# Patient Record
Sex: Male | Born: 1975 | Race: White | Hispanic: No | Marital: Married | Smoking: Never smoker
Health system: Southern US, Community
[De-identification: ages and names within clinical notes are randomized; demographics above are authoritative.]

## PROBLEM LIST (undated history)

## (undated) DIAGNOSIS — C801 Malignant (primary) neoplasm, unspecified: Secondary | ICD-10-CM

## (undated) DIAGNOSIS — S3992XA Unspecified injury of lower back, initial encounter: Secondary | ICD-10-CM

## (undated) DIAGNOSIS — R251 Tremor, unspecified: Secondary | ICD-10-CM

## (undated) HISTORY — PX: INCISION AND DRAINAGE ABSCESS: SHX5864

## (undated) HISTORY — DX: Unspecified injury of lower back, initial encounter: S39.92XA

## (undated) HISTORY — PX: WISDOM TOOTH EXTRACTION: SHX21

## (undated) HISTORY — DX: Tremor, unspecified: R25.1

---

## 2009-10-20 ENCOUNTER — Emergency Department: Payer: Self-pay | Admitting: Unknown Physician Specialty

## 2012-08-13 ENCOUNTER — Ambulatory Visit (INDEPENDENT_AMBULATORY_CARE_PROVIDER_SITE_OTHER): Payer: BC Managed Care – PPO | Admitting: Internal Medicine

## 2012-08-27 ENCOUNTER — Ambulatory Visit (INDEPENDENT_AMBULATORY_CARE_PROVIDER_SITE_OTHER): Payer: BC Managed Care – PPO | Admitting: Internal Medicine

## 2012-08-27 ENCOUNTER — Encounter: Payer: Self-pay | Admitting: Internal Medicine

## 2012-08-27 VITALS — BP 110/62 | HR 62 | Temp 98.8°F | Ht 68.75 in | Wt 212.0 lb

## 2012-08-27 DIAGNOSIS — G252 Other specified forms of tremor: Secondary | ICD-10-CM

## 2012-08-27 DIAGNOSIS — G250 Essential tremor: Secondary | ICD-10-CM

## 2012-08-27 DIAGNOSIS — R209 Unspecified disturbances of skin sensation: Secondary | ICD-10-CM

## 2012-08-27 DIAGNOSIS — Z0000 Encounter for general adult medical examination without abnormal findings: Secondary | ICD-10-CM

## 2012-08-27 DIAGNOSIS — R200 Anesthesia of skin: Secondary | ICD-10-CM | POA: Insufficient documentation

## 2012-08-27 LAB — CBC WITH DIFFERENTIAL/PLATELET
Basophils Absolute: 0 10*3/uL (ref 0.0–0.1)
Basophils Relative: 1 % (ref 0–1)
Eosinophils Absolute: 0.2 10*3/uL (ref 0.0–0.7)
Eosinophils Relative: 3 % (ref 0–5)
HCT: 42.2 % (ref 39.0–52.0)
Hemoglobin: 14.6 g/dL (ref 13.0–17.0)
MCH: 30.9 pg (ref 26.0–34.0)
MCHC: 34.6 g/dL (ref 30.0–36.0)
MCV: 89.4 fL (ref 78.0–100.0)
Monocytes Absolute: 0.3 10*3/uL (ref 0.1–1.0)
Monocytes Relative: 5 % (ref 3–12)
RDW: 13.3 % (ref 11.5–15.5)

## 2012-08-27 MED ORDER — ATENOLOL 25 MG PO TABS: 25.0000 mg | ORAL_TABLET | Freq: Every day | ORAL | Status: AC

## 2012-08-27 NOTE — Progress Notes (Signed)
Subjective:    Patient ID: James Tyler, male    DOB: 12-21-1975, 37 y.o.   MRN: [id number]  HPI 37 year old male with history of bilateral hand tremor presents to establish care. He reports that he has had a tremor in both of his hands for years. He had an episode in the past of worsening symptoms of tremor, precipitated by increased stress. He was briefly put on medication which he thinks may have been a beta blocker. He had some improvement with this but then subsequently discontinued the medication. He has not been on medication for years. Over the last several months, he has had some increased stress at work and has noted worsening symptoms of tremor. Shaking in his hands seems most prominent when using both hands at the same time. He notices symptoms particularly when holding drinks. Symptoms are exacerbated by stress and improved with use of alcohol. He denies any weakness or numbness in his hands.   He notes this has been a very stressful time at work. There was some concern that he may lose his job. He is also starting grad school. He is engaged to be married and living with his girlfriend.  He also notes a several year history of numbness across his right anterior thigh. This has never been evaluated. He thinks the numbness may be secondary to traumatic injury to his back which occurred playing football several years ago. He denies any weakness in his leg. He has never had other focal neurologic symptoms such as weakness, other areas of numbness, loss of bowel or bladder function, visual changes.  Outpatient Encounter Prescriptions as of 08/27/2012  Medication Sig Dispense Refill  . atenolol (TENORMIN) 25 MG tablet Take 1 tablet (25 mg total) by mouth daily.  90 tablet  3   No facility-administered encounter medications on file as of 08/27/2012.   BP 110/62  Pulse 62  Temp(Src) 98.8 F (37.1 C) (Oral)  Ht 5' 8.75" (1.746 m)  Wt 212 lb (96.163 kg)  BMI 31.54 kg/m2  SpO2 98%  Review  of Systems  Constitutional: Negative for fever, chills, activity change, appetite change, fatigue and unexpected weight change.  Eyes: Negative for visual disturbance.  Respiratory: Negative for cough and shortness of breath.   Cardiovascular: Negative for chest pain, palpitations and leg swelling.  Gastrointestinal: Negative for abdominal pain and abdominal distention.  Genitourinary: Negative for dysuria, urgency and difficulty urinating.  Musculoskeletal: Negative for arthralgias and gait problem.  Skin: Negative for color change and rash.  Neurological: Positive for tremors and numbness. Negative for dizziness, seizures, syncope, facial asymmetry, speech difficulty, weakness, light-headedness and headaches.  Hematological: Negative for adenopathy.  Psychiatric/Behavioral: Negative for sleep disturbance and dysphoric mood. The patient is not nervous/anxious.        Objective:   Physical Exam  Constitutional: He is oriented to person, place, and time. He appears well-developed and well-nourished. No distress.  HENT:  Head: Normocephalic and atraumatic.  Right Ear: External ear normal.  Left Ear: External ear normal.  Nose: Nose normal.  Mouth/Throat: Oropharynx is clear and moist. No oropharyngeal exudate.  Eyes: Conjunctivae and EOM are normal. Pupils are equal, round, and reactive to light. Right eye exhibits no discharge. Left eye exhibits no discharge. No scleral icterus.  Neck: Normal range of motion. Neck supple. No tracheal deviation present. No thyromegaly present.  Cardiovascular: Normal rate, regular rhythm and normal heart sounds.  Exam reveals no gallop and no friction rub.   No murmur heard. Pulmonary/Chest: Effort normal  and breath sounds normal. No respiratory distress. He has no wheezes. He has no rales. He exhibits no tenderness.  Musculoskeletal: Normal range of motion. He exhibits no edema.  Lymphadenopathy:    He has no cervical adenopathy.  Neurological: He is  alert and oriented to person, place, and time. He has normal strength. He displays tremor (resting tremor bilateral hands). He displays no atrophy. No cranial nerve deficit or sensory deficit. He exhibits normal muscle tone. Coordination and gait normal.  Skin: Skin is warm and dry. No rash noted. He is not diaphoretic. No erythema. No pallor.  Psychiatric: He has a normal mood and affect. His behavior is normal. Judgment and thought content normal.          Assessment & Plan:

## 2012-08-27 NOTE — Assessment & Plan Note (Signed)
Bilateral hand tremor over several years, worsened with increased stress. Consistent with benign essential tremor. Will restart betablocker, Atenolol, given recent worsening of symptoms. Will set up neurology evaluation to see if any additional testing might be helpful, particularly given focal numbness in right thigh.

## 2012-08-27 NOTE — Assessment & Plan Note (Signed)
Localized area of numbness right anterior thigh. Question if this may be related to previous h/o injury to the lumbar spine. Exam normal today and sensation intact to light touch, monofilament. Will check TSH, B12 with labs. Will set up neurology evaluation. Question if EMG testing might be helpful.

## 2012-08-28 LAB — COMPREHENSIVE METABOLIC PANEL
ALT: 27 U/L (ref 0–53)
AST: 19 U/L (ref 0–37)
Alkaline Phosphatase: 55 U/L (ref 39–117)
Calcium: 10.2 mg/dL (ref 8.4–10.5)
Chloride: 104 mEq/L (ref 96–112)
Creat: 1.14 mg/dL (ref 0.50–1.35)
Total Bilirubin: 0.7 mg/dL (ref 0.3–1.2)

## 2012-08-28 LAB — LIPID PANEL
LDL Cholesterol: 127 mg/dL — ABNORMAL HIGH (ref 0–99)
Total CHOL/HDL Ratio: 3.6 Ratio
VLDL: 24 mg/dL (ref 0–40)

## 2012-08-30 ENCOUNTER — Encounter: Payer: Self-pay | Admitting: *Deleted

## 2012-09-21 ENCOUNTER — Ambulatory Visit (INDEPENDENT_AMBULATORY_CARE_PROVIDER_SITE_OTHER): Payer: BC Managed Care – PPO | Admitting: Neurology

## 2012-09-21 ENCOUNTER — Encounter: Payer: Self-pay | Admitting: Neurology

## 2012-09-21 VITALS — BP 104/62 | HR 52 | Temp 98.3°F | Resp 16 | Wt 214.0 lb

## 2012-09-21 DIAGNOSIS — G5710 Meralgia paresthetica, unspecified lower limb: Secondary | ICD-10-CM

## 2012-09-21 DIAGNOSIS — G250 Essential tremor: Secondary | ICD-10-CM

## 2012-09-21 DIAGNOSIS — G252 Other specified forms of tremor: Secondary | ICD-10-CM

## 2012-09-21 DIAGNOSIS — G5711 Meralgia paresthetica, right lower limb: Secondary | ICD-10-CM | POA: Insufficient documentation

## 2012-09-21 NOTE — Patient Instructions (Addendum)
1.  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  Let us know if you need [place] in the future.  Good luck in grad school!

## 2012-09-21 NOTE — Progress Notes (Signed)
Subjective:   James Tyler was seen in consultation in the movement disorder clinic at the request of Wynona Dove, MD. The evaluation is for tremor.  The patient is a 37 y.o. right handed male with a history of tremor.  He reports tremor for as long as he can remember.  He remembers tremor being noticed even into 5th grade.  It does not affect what he does, but does increase with stress.  Both hands are the safe.  There is no family hx of tremor.    Affected by caffeine:  no Affected by alcohol:  yes Affected by stress:  yes (and under increase amt of stress at work as job is being audited) Affected by fatigue:  no Spills soup if on spoon:  no Spills glass of liquid if full:  no Affects ADL's (tying shoes, brushing teeth, etc):  no  Current/Previously tried tremor medications: atenolol started 08/27/12 (noted that it is helpful and maybe little drowsy but nothing significant)  Current medications that may exacerbate tremor:  n/a  He also c/o R anterior thigh paresthesias.   He states that the numbness started about 4 years ago.  There is no weakness.  He assumes it is a football injury, even though he has not played since high school.  He does state that he is more out of shape and has gained weight over the years.  If he scratches the area, he notes that it does not feel the same as the other side.  He has no back pain.  Outside reports reviewed: historical medical records.  No Known Allergies  Current Outpatient Prescriptions on File Prior to Visit  Medication Sig Dispense Refill  . atenolol (TENORMIN) 25 MG tablet Take 1 tablet (25 mg total) by mouth daily.  90 tablet  3   No current facility-administered medications on file prior to visit.    Past Medical History  Diagnosis Date  . Tremors of nervous system     hand tremor  . Back injury 1997, 2000    Football    Past Surgical History  Procedure Laterality Date  . Wisdom tooth extraction    . Incision and  drainage abscess      History   Social History  . Marital Status: Single    Spouse Name: N/A    Number of Children: N/A  . Years of Education: N/A   Occupational History  . Not on file.   Social History Main Topics  . Smoking status: Never Smoker   . Smokeless tobacco: Never Used  . Alcohol Use: Yes     Comment: social  . Drug Use: No  . Sexually Active: Not on file   Other Topics Concern  . Not on file   Social History Narrative   Lives in [place] with fiance. Dog and 3 cats.      Work - Interior and spatial designer, Engineer, agricultural      Diet - regular      Exercise - runs occasionally    Family Status  Relation Status Death Age  . Mother Alive     healthy  . Father Alive     healthy    Review of Systems A complete 10 system ROS was obtained and was negative apart from what is mentioned.   Objective:   VITALS:   Filed Vitals:   09/21/12 1245  BP: 104/62  Pulse: 52  Temp: 98.3 F (36.8 C)  Resp: 16  Weight: 214 lb (97.07 kg)  Gen:  Appears stated age and in NAD. HEENT:  Normocephalic, atraumatic. The mucous membranes are moist. The superficial temporal arteries are without ropiness or tenderness. Cardiovascular: Regular rate and rhythm. Lungs: Clear to auscultation bilaterally. Neck: There are no carotid bruits noted bilaterally.  NEUROLOGICAL:  Orientation:  The patient is alert and oriented x 3.  Recent and remote memory are intact.  Attention span and concentration are normal.  Able to name objects and repeat without trouble.  Fund of knowledge is appropriate Cranial nerves: There is good facial symmetry. The pupils are equal round and reactive to light bilaterally. Fundoscopic exam reveals clear disc margins bilaterally. Extraocular muscles are intact and visual fields are full to confrontational testing. Speech is fluent and clear. Soft palate rises symmetrically and there is no tongue deviation. Hearing is intact to conversational tone. Tone:  Tone is good throughout. Sensation: Sensation is intact to light touch and pinprick throughout (facial, trunk, extremities). There is decreased pin over the R anterior and lat thigh c/t the L.  Vibration is intact at the bilateral big toe. There is no extinction with double simultaneous stimulation. There is no sensory dermatomal level identified. Coordination:  The patient has no dysdiadichokinesia or dysmetria. Motor: Strength is 5/5 in the bilateral upper and lower extremities.  Shoulder shrug is equal bilaterally.  There is no pronator drift.  There are no fasciculations noted. DTR's: Deep tendon reflexes are 2/4 at the bilateral biceps, triceps, brachioradialis, patella and achilles.  Plantar responses are downgoing bilaterally. Gait and Station: The patient is able to ambulate without difficulty. The patient is able to heel toe walk without any difficulty. The patient is able to ambulate in a tandem fashion. The patient is able to stand in the Romberg position.   MOVEMENT EXAM: Tremor:  There is minimal tremor in the RUE, noted most significantly with posture.  None is seen in the L.  The patient is able to draw Archimedes spirals without significant difficulty.  There is no tremor at rest.      LABS:  Lab Results  Component Value Date   WBC 6.5 08/27/2012   HGB 14.6 08/27/2012   HCT 42.2 08/27/2012   MCV 89.4 08/27/2012   PLT 261 08/27/2012   Lab Results  Component Value Date   TSH 1.491 08/27/2012     Chemistry      Component Value Date/Time   NA 140 08/27/2012 1514   K 4.5 08/27/2012 1514   CL 104 08/27/2012 1514   CO2 28 08/27/2012 1514   BUN 10 08/27/2012 1514   CREATININE 1.14 08/27/2012 1514      Component Value Date/Time   CALCIUM 10.2 08/27/2012 1514   ALKPHOS 55 08/27/2012 1514   AST 19 08/27/2012 1514   ALT 27 08/27/2012 1514   BILITOT 0.7 08/27/2012 1514          Assessment/Plan:   1.  Tremor.  -This could be benign essential tremor but it could also be an enhanced  physiologic tremor.  Either way, the tremor is very minimal currently and he is happy with the atenolol.  I would not change that. 2.  right lateral thigh paresthesias.  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  He and I talked about the pathophysiology of this.  It really has not been particularly bothersome.  He does relate that he has gained weight, which could be the etiology.  Nonetheless, I do not think that he needs any treatment.  He has  no pain associated with it, including back pain. 3.  He will followup with me on an as-needed basis.

## 2012-10-18 ENCOUNTER — Telehealth: Payer: Self-pay | Admitting: *Deleted

## 2012-10-18 NOTE — Telephone Encounter (Signed)
Patient left message because he received a bill for his last visit, would like someone to explain why it is showing a copayment? He did make his copayment when he was here.

## 2012-10-20 NOTE — Telephone Encounter (Signed)
I asked patient to return my call I am going to give him the billing number which is [phone] which I left on his voice mail. I am not sure why it is showing a co-pay.

## 2013-02-18 ENCOUNTER — Ambulatory Visit: Payer: BC Managed Care – PPO | Admitting: Internal Medicine

## 2019-03-21 ENCOUNTER — Other Ambulatory Visit: Payer: Self-pay

## 2019-03-25 ENCOUNTER — Ambulatory Visit: Payer: BC Managed Care – PPO | Attending: Internal Medicine

## 2019-03-25 DIAGNOSIS — Z20822 Contact with and (suspected) exposure to covid-19: Secondary | ICD-10-CM

## 2019-03-26 LAB — NOVEL CORONAVIRUS, NAA: SARS-CoV-2, NAA: NOT DETECTED

## 2019-05-07 ENCOUNTER — Emergency Department
Admission: EM | Admit: 2019-05-07 | Discharge: 2019-05-07 | Disposition: A | Discharge status: Home / Self Care | Payer: BC Managed Care – PPO | Attending: Student in an Organized Health Care Education/Training Program | Admitting: Student in an Organized Health Care Education/Training Program

## 2019-05-07 ENCOUNTER — Emergency Department: Payer: BC Managed Care – PPO

## 2019-05-07 ENCOUNTER — Other Ambulatory Visit: Payer: Self-pay

## 2019-05-07 DIAGNOSIS — Z79899 Other long term (current) drug therapy: Secondary | ICD-10-CM | POA: Insufficient documentation

## 2019-05-07 DIAGNOSIS — R079 Chest pain, unspecified: Secondary | ICD-10-CM | POA: Diagnosis not present

## 2019-05-07 DIAGNOSIS — C6290 Malignant neoplasm of unspecified testis, unspecified whether descended or undescended: Secondary | ICD-10-CM | POA: Insufficient documentation

## 2019-05-07 DIAGNOSIS — R55 Syncope and collapse: Secondary | ICD-10-CM

## 2019-05-07 DIAGNOSIS — R42 Dizziness and giddiness: Secondary | ICD-10-CM | POA: Insufficient documentation

## 2019-05-07 HISTORY — DX: Malignant (primary) neoplasm, unspecified: C80.1

## 2019-05-07 LAB — CBC
HCT: 39.7 % (ref 39.0–52.0)
Hemoglobin: 13.1 g/dL (ref 13.0–17.0)
MCH: 31.5 pg (ref 26.0–34.0)
MCHC: 33 g/dL (ref 30.0–36.0)
MCV: 95.4 fL (ref 80.0–100.0)
Platelets: 219 10*3/uL (ref 150–400)
RBC: 4.16 MIL/uL — ABNORMAL LOW (ref 4.22–5.81)
RDW: 12.4 % (ref 11.5–15.5)
WBC: 12.3 10*3/uL — ABNORMAL HIGH (ref 4.0–10.5)
nRBC: 0 % (ref 0.0–0.2)

## 2019-05-07 LAB — BASIC METABOLIC PANEL
Anion gap: 7 (ref 5–15)
BUN: 13 mg/dL (ref 6–20)
CO2: 29 mmol/L (ref 22–32)
Calcium: 9 mg/dL (ref 8.9–10.3)
Chloride: 102 mmol/L (ref 98–111)
Creatinine, Ser: 1.19 mg/dL (ref 0.61–1.24)
GFR calc Af Amer: >60 mL/min (ref >60)
GFR calc non Af Amer: >60 mL/min (ref >60)
Glucose, Bld: 166 mg/dL — ABNORMAL HIGH (ref 70–99)
Potassium: 3.8 mmol/L (ref 3.5–5.1)
Sodium: 138 mmol/L (ref 135–145)

## 2019-05-07 LAB — TROPONIN I (HIGH SENSITIVITY)
Troponin I (High Sensitivity): <2 ng/L (ref <18)
Troponin I (High Sensitivity): <2 ng/L (ref <18)

## 2019-05-07 MED ORDER — MORPHINE SULFATE (PF) 4 MG/ML IV SOLN
4.0000 mg | INTRAVENOUS | Status: DC | PRN
  Administered 2019-05-07: 4 mg via INTRAVENOUS
  Filled 2019-05-07: qty 1

## 2019-05-07 MED ORDER — SODIUM CHLORIDE 0.9 % IV BOLUS
1000.0000 mL | Freq: Once | INTRAVENOUS | Status: AC
  Administered 2019-05-07: 12:00:00 1000 mL via INTRAVENOUS

## 2019-05-07 MED ORDER — ONDANSETRON HCL 4 MG/2ML IJ SOLN
4.0000 mg | Freq: Once | INTRAMUSCULAR | Status: AC
  Administered 2019-05-07: 4 mg via INTRAVENOUS
  Filled 2019-05-07: qty 2

## 2019-05-07 MED ORDER — SODIUM CHLORIDE 0.9 % IV BOLUS: 500.0000 mL | Freq: Once | INTRAVENOUS | Status: DC

## 2019-05-07 MED ORDER — IOHEXOL 350 MG/ML SOLN
75.0000 mL | Freq: Once | INTRAVENOUS | Status: AC | PRN
  Administered 2019-05-07: 75 mL via INTRAVENOUS

## 2019-05-07 NOTE — Discharge Instructions (Addendum)
Please be sure to drink plenty of fluids.  Follow up with PCP and Urology.

## 2019-05-07 NOTE — ED Triage Notes (Signed)
Pt arrives via EMS from home after having a syncopal episode that lasted about a minute per pt wife after getting up and attempting to urinate- pt had testicular cancer removed at [facility] yesterday- pt denies pain in head and neck- VSS per EMS

## 2019-05-07 NOTE — ED Provider Notes (Signed)
[facility] Emergency Department Provider Note    First MD Initiated Contact with Patient 05/07/19 1115     (approximate)  I have reviewed the triage vital signs and the nursing notes.   HISTORY  Chief Complaint Loss of Consciousness    HPI James Tyler is a 44 y.o. male with recent diagnosis of testicular cancer status post nephrectomy yesterday at [facility] presents the ER for syncopal episode.  Patient states he is otherwise feeling well.  Is having appropriate pain after the surgery woke up this morning and felt like he could not get out of bed due to pain.  Did take some pain medication and then got himself sitting the side of the bed was having worsening right groin pain related to surgery but then decided that he was going to try to get up to go use the restroom.  When he stood up he apparently passed out for less than a minute.  It was witnessed by his wife.  Denies any headache blurry vision or neck pain.  Denies any chest pain or palpitations.  No shortness of breath.  States currently his pain is mild.  Denies any history of seizure disorder or cardiac issues.  He is not on chemotherapy.    Past Medical History:  Diagnosis Date  . Back injury 1997, 2000   Football  . Cancer ([place])    testicular  . Tremors of nervous system    hand tremor   No family history on file. Past Surgical History:  Procedure Laterality Date  . INCISION AND DRAINAGE ABSCESS    . WISDOM TOOTH EXTRACTION     Patient Active Problem List   Diagnosis Date Noted  . Meralgia paresthetica of right side 09/21/2012  . Benign essential tremor 08/27/2012  . Numbness in right leg 08/27/2012      Prior to Admission medications   Medication Sig Start Date End Date Taking? Authorizing Provider  atenolol (TENORMIN) 25 MG tablet Take 1 tablet (25 mg total) by mouth daily. 08/27/12   Jackolyn Confer, MD    Allergies Patient has no known allergies.    Social History Social  History   Tobacco Use  . Smoking status: Never Smoker  . Smokeless tobacco: Never Used  Substance Use Topics  . Alcohol use: Yes    Comment: social  . Drug use: No    Review of Systems Patient denies headaches, rhinorrhea, blurry vision, numbness, shortness of breath, chest pain, edema, cough, abdominal pain, nausea, vomiting, diarrhea, dysuria, fevers, rashes or hallucinations unless otherwise stated above in HPI. ____________________________________________   PHYSICAL EXAM:  VITAL SIGNS: Vitals:   05/07/19 1130 05/07/19 1202  BP: 112/64 (!) 106/57  Pulse: (!) 55 (!) 57  Resp: 16 16  Temp:    SpO2: 98% 98%    Constitutional: Alert and oriented.  Eyes: Conjunctivae are normal.  Head: Atraumatic. Nose: No congestion/rhinnorhea. Mouth/Throat: Mucous membranes are moist.   Neck: No stridor. Painless ROM.  Cardiovascular: Normal rate, regular rhythm. Grossly normal heart sounds.  Good peripheral circulation. Respiratory: Normal respiratory effort.  No retractions. Lungs CTAB. Gastrointestinal: Soft and nontender. No distention. No abdominal bruits. No CVA tenderness. Genitourinary:  Musculoskeletal: No lower extremity tenderness nor edema.  No joint effusions. Neurologic:  Normal speech and language. No gross focal neurologic deficits are appreciated. No facial droop Skin:  Skin is warm, dry and intact. No rash noted. Psychiatric: Mood and affect are normal. Speech and behavior are normal.  ____________________________________________  LABS (all labs ordered are listed, but only abnormal results are displayed)  Results for orders placed or performed during the hospital encounter of 05/07/19 (from the past 24 hour(s))  Basic metabolic panel     Status: Abnormal   Collection Time: 05/07/19 11:20 AM  Result Value Ref Range   Sodium 138 135 - 145 mmol/L   Potassium 3.8 3.5 - 5.1 mmol/L   Chloride 102 98 - 111 mmol/L   CO2 29 22 - 32 mmol/L   Glucose, Bld 166 (H) 70 -  99 mg/dL   BUN 13 6 - 20 mg/dL   Creatinine, Ser 1.19 0.61 - 1.24 mg/dL   Calcium 9.0 8.9 - 10.3 mg/dL   GFR calc non Af Amer >60 >60 mL/min   GFR calc Af Amer >60 >60 mL/min   Anion gap 7 5 - 15  CBC     Status: Abnormal   Collection Time: 05/07/19 11:20 AM  Result Value Ref Range   WBC 12.3 (H) 4.0 - 10.5 K/uL   RBC 4.16 (L) 4.22 - 5.81 MIL/uL   Hemoglobin 13.1 13.0 - 17.0 g/dL   HCT 39.7 39.0 - 52.0 %   MCV 95.4 80.0 - 100.0 fL   MCH 31.5 26.0 - 34.0 pg   MCHC 33.0 30.0 - 36.0 g/dL   RDW 12.4 11.5 - 15.5 %   Platelets 219 150 - 400 K/uL   nRBC 0.0 0.0 - 0.2 %  Troponin I (High Sensitivity)     Status: None   Collection Time: 05/07/19 11:20 AM  Result Value Ref Range   Troponin I (High Sensitivity) <2 <18 ng/L  Troponin I (High Sensitivity)     Status: None   Collection Time: 05/07/19  1:54 PM  Result Value Ref Range   Troponin I (High Sensitivity) <2 <18 ng/L   ____________________________________________  EKG My review and personal interpretation at Time: 11:26   Indication: fainting spell  Rate: 60  Rhythm: sinus Axis: normal Other: normal intervals, no stemi ____________________________________________  RADIOLOGY  I personally reviewed all radiographic images ordered to evaluate for the above acute complaints and reviewed radiology reports and findings.  These findings were personally discussed with the patient.  Please see medical record for radiology report.  ____________________________________________   PROCEDURES  Procedure(s) performed:  Procedures    Critical Care performed: no ____________________________________________   INITIAL IMPRESSION / ASSESSMENT AND PLAN / ED COURSE  Pertinent labs & imaging results that were available during my care of the patient were reviewed by me and considered in my medical decision making (see chart for details).   DDX: Dehydration, electrolyte abnormality, PE, seizure, sepsis, vasovagal, dysrhythmia  James Tyler is a 44 y.o. who presents to the ED with symptoms as described above.  Currently well-appearing.  Suspect some component of dehydration or may be pain reaction with vasovagal.  His work-up has been largely unremarkable.  CT imaging without evidence of acute abnormality.  He has been well-appearing in no acute distress.  He stable and appropriate for outpatient follow-up.     The patient was evaluated in Emergency Department today for the symptoms described in the history of present illness. He/she was evaluated in the context of the global COVID-19 pandemic, which necessitated consideration that the patient might be at risk for infection with the SARS-CoV-2 virus that causes COVID-19. Institutional protocols and algorithms that pertain to the evaluation of patients at risk for COVID-19 are in a state of rapid change based on information released  by regulatory bodies including the CDC and federal and state organizations. These policies and algorithms were followed during the patient's care in the ED.  As part of my medical decision making, I reviewed the following data within the [place] notes reviewed and incorporated, Labs reviewed, notes from prior ED visits and [place] Controlled Substance Database   ____________________________________________   FINAL CLINICAL IMPRESSION(S) / ED DIAGNOSES  Final diagnoses:  Syncope and collapse      NEW MEDICATIONS STARTED DURING THIS VISIT:  New Prescriptions   No medications on file     Note:  This document was prepared using Dragon voice recognition software and may include unintentional dictation errors.    Merlyn Lot, MD 05/07/19 1556

## 2019-06-01 ENCOUNTER — Other Ambulatory Visit: Payer: Self-pay

## 2019-06-01 ENCOUNTER — Emergency Department
Admission: EM | Admit: 2019-06-01 | Discharge: 2019-06-01 | Disposition: A | Discharge status: Home / Self Care | Payer: BC Managed Care – PPO | Attending: Emergency Medicine | Admitting: Emergency Medicine

## 2019-06-01 ENCOUNTER — Emergency Department: Payer: BC Managed Care – PPO

## 2019-06-01 ENCOUNTER — Encounter: Payer: Self-pay | Admitting: Emergency Medicine

## 2019-06-01 DIAGNOSIS — R079 Chest pain, unspecified: Secondary | ICD-10-CM | POA: Diagnosis not present

## 2019-06-01 DIAGNOSIS — Z79899 Other long term (current) drug therapy: Secondary | ICD-10-CM | POA: Diagnosis not present

## 2019-06-01 DIAGNOSIS — Z8547 Personal history of malignant neoplasm of testis: Secondary | ICD-10-CM | POA: Diagnosis not present

## 2019-06-01 LAB — BASIC METABOLIC PANEL
Anion gap: 9 (ref 5–15)
BUN: 14 mg/dL (ref 6–20)
CO2: 27 mmol/L (ref 22–32)
Calcium: 9.6 mg/dL (ref 8.9–10.3)
Chloride: 103 mmol/L (ref 98–111)
Creatinine, Ser: 1.02 mg/dL (ref 0.61–1.24)
GFR calc Af Amer: >60 mL/min (ref >60)
GFR calc non Af Amer: >60 mL/min (ref >60)
Glucose, Bld: 106 mg/dL — ABNORMAL HIGH (ref 70–99)
Potassium: 4 mmol/L (ref 3.5–5.1)
Sodium: 139 mmol/L (ref 135–145)

## 2019-06-01 LAB — CBC
HCT: 42.1 % (ref 39.0–52.0)
Hemoglobin: 14.1 g/dL (ref 13.0–17.0)
MCH: 31.3 pg (ref 26.0–34.0)
MCHC: 33.5 g/dL (ref 30.0–36.0)
MCV: 93.6 fL (ref 80.0–100.0)
Platelets: 255 10*3/uL (ref 150–400)
RBC: 4.5 MIL/uL (ref 4.22–5.81)
RDW: 11.9 % (ref 11.5–15.5)
WBC: 6.6 10*3/uL (ref 4.0–10.5)
nRBC: 0 % (ref 0.0–0.2)

## 2019-06-01 LAB — TROPONIN I (HIGH SENSITIVITY)
Troponin I (High Sensitivity): <2 ng/L (ref <18)
Troponin I (High Sensitivity): <2 ng/L (ref <18)

## 2019-06-01 MED ORDER — IOHEXOL 350 MG/ML SOLN
75.0000 mL | Freq: Once | INTRAVENOUS | Status: AC | PRN
  Administered 2019-06-01: 75 mL via INTRAVENOUS
  Filled 2019-06-01: qty 75

## 2019-06-01 NOTE — ED Provider Notes (Signed)
[facility] Emergency Department Provider Note  ____________________________________________   First MD Initiated Contact with Patient 06/01/19 1441     (approximate)  I have reviewed the triage vital signs and the nursing notes.   HISTORY  Chief Complaint Chest Pain   HPI James Tyler is a 44 y.o. male with below list of previous medical conditions including testicular cancer and thyroid cancer presents to the emergency department secondary to acute onset of intermittent central/left-sided chest pain described as sharp since this morning.  Patient also admits to associated dyspnea which the patient describes as inability to take a full breath during the events.  Patient denies any lower extremity pain or swelling.  No history of DVT or PE.  Current pain score 2 out of 10.  Patient states that the pain is reproducible palpation of the chest however that there is a separate pain within the chest that he feels        Past Medical History:  Diagnosis Date  . Back injury 1997, 2000   Football  . Cancer ([place])    testicular  . Tremors of nervous system    hand tremor    Patient Active Problem List   Diagnosis Date Noted  . Meralgia paresthetica of right side 09/21/2012  . Benign essential tremor 08/27/2012  . Numbness in right leg 08/27/2012    Past Surgical History:  Procedure Laterality Date  . INCISION AND DRAINAGE ABSCESS    . WISDOM TOOTH EXTRACTION      Prior to Admission medications   Medication Sig Start Date End Date Taking? Authorizing Provider  atenolol (TENORMIN) 25 MG tablet Take 1 tablet (25 mg total) by mouth daily. 08/27/12   Jackolyn Confer, MD    Allergies Patient has no known allergies.  No family history on file.  Social History Social History   Tobacco Use  . Smoking status: Never Smoker  . Smokeless tobacco: Never Used  Substance Use Topics  . Alcohol use: Yes  . Drug use: No    Review of  Systems Constitutional: No fever/chills Eyes: No visual changes. ENT: No sore throat. Cardiovascular: Positive for chest pain. Respiratory: Denies shortness of breath. Gastrointestinal: No abdominal pain.  No nausea, no vomiting.  No diarrhea.  No constipation. Genitourinary: Negative for dysuria. Musculoskeletal: Negative for neck pain.  Negative for back pain. Integumentary: Negative for rash. Neurological: Negative for headaches, focal weakness or numbness.   ____________________________________________   PHYSICAL EXAM:  VITAL SIGNS: ED Triage Vitals  Enc Vitals Group     BP 06/01/19 1154 134/73     Pulse Rate 06/01/19 1516 65     Resp 06/01/19 1154 20     Temp 06/01/19 1154 98 F (36.7 C)     Temp Source 06/01/19 1154 Oral     SpO2 06/01/19 1154 100 %     Weight 06/01/19 1151 99.8 kg (220 lb)     Height 06/01/19 1151 1.753 m (5\' 9" )     Head Circumference --      Peak Flow --      Pain Score 06/01/19 1150 2     Pain Loc --      Pain Edu? --      Excl. in [place]? --     Constitutional: Alert and oriented.  Eyes: Conjunctivae are normal.  Mouth/Throat: Patient is wearing a mask. Neck: No stridor.  No meningeal signs.   Cardiovascular: Normal rate, regular rhythm. Good peripheral circulation. Grossly normal heart sounds.  Respiratory: Normal respiratory effort.  No retractions. Gastrointestinal: Soft and nontender. No distention.  Musculoskeletal: No lower extremity tenderness nor edema. No gross deformities of extremities. Neurologic:  Normal speech and language. No gross focal neurologic deficits are appreciated.  Skin:  Skin is warm, dry and intact. Psychiatric: Mood and affect are normal. Speech and behavior are normal.  ____________________________________________   LABS (all labs ordered are listed, but only abnormal results are displayed)  Labs Reviewed  BASIC METABOLIC PANEL - Abnormal; Notable for the following components:      Result Value   Glucose,  Bld 106 (*)    All other components within normal limits  CBC  TROPONIN I (HIGH SENSITIVITY)  TROPONIN I (HIGH SENSITIVITY)   ____________________________________________  EKG  ED ECG REPORT I, [place] Yenty Bloch, the attending physician, personally viewed and interpreted this ECG.   Date: 06/01/2019  EKG Time: 11:50 AM  Rate: 59  Rhythm: Sinus bradycardia  Axis: Normal  Intervals: Normal  ST&T Change: None  ____________________________________________  RADIOLOGY I, [place] Sobia Karger, personally viewed and evaluated these images (plain radiographs) as part of my medical decision making, as well as reviewing the written report by the radiologist.  ED MD interpretation: Chest x-ray impression normal study per radiologist.   CT angiogram revealed no evidence of pulmonary embolism.  No acute findings in the chest per radiologist.  Official radiology report(s): DG Chest 2 View  Result Date: 06/01/2019 CLINICAL DATA:  Left side chest pain EXAM: CHEST - 2 VIEW COMPARISON:  05/07/2019 FINDINGS: Heart and mediastinal contours are within normal limits. No focal opacities or effusions. No acute bony abnormality. IMPRESSION: Normal study. Electronically Signed   By: Rolm Baptise M.D.   On: 06/01/2019 12:14   CT Angio Chest PE W and/or Wo Contrast  Result Date: 06/01/2019 CLINICAL DATA:  Chest pain with dyspnea. History of thyroid and testicular cancer. Intermittent left-sided chest pain since this morning with associated shortness of breath. EXAM: CT ANGIOGRAPHY CHEST WITH CONTRAST TECHNIQUE: Multidetector CT imaging of the chest was performed using the standard protocol during bolus administration of intravenous contrast. Multiplanar CT image reconstructions and MIPs were obtained to evaluate the vascular anatomy. CONTRAST:  86mL OMNIPAQUE IOHEXOL 350 MG/ML SOLN COMPARISON:  05/07/2019 FINDINGS: Cardiovascular: Contrast injection is sufficient to demonstrate satisfactory opacification of the  pulmonary arteries to the segmental level. There is no pulmonary embolus. The main pulmonary artery is within normal limits for size. There is no CT evidence of acute right heart strain. The visualized aorta is normal. Heart size is normal, without pericardial effusion. Mediastinum/Nodes: --No mediastinal or hilar lymphadenopathy. --No axillary lymphadenopathy. --No supraclavicular lymphadenopathy. --Normal thyroid gland. --The esophagus is unremarkable Lungs/Pleura: No pulmonary nodules or masses. No pleural effusion or pneumothorax. No focal airspace consolidation. No focal pleural abnormality. Upper Abdomen: No acute abnormality. Musculoskeletal: No chest wall abnormality. No acute or significant osseous findings. Review of the MIP images confirms the above findings. IMPRESSION: 1. No evidence of pulmonary embolism. 2. No acute findings in the chest. Electronically Signed   By: Constance Holster M.D.   On: 06/01/2019 15:53    ____________________________________________     Procedures   ____________________________________________   INITIAL IMPRESSION / MDM / [place] / ED COURSE  As part of my medical decision making, I reviewed the following data within the electronic MEDICAL RECORD NUMBER  44 year old male presented with above-stated history and physical exam secondary to chest pain with differential diagnosis including but not limited to ACS, pulmonary  emboli, GERD/indigestion.  EKG revealed no evidence of ischemia or infarction.  Laboratory data including high-sensitivity troponin negative x2.  CT scan of the chest PE protocol revealed no evidence of a pulmonary emboli or any other gross pathology within the chest per radiologist.  Spoke with the patient and his wife at length regarding all clinical findings.  I also informed his wife and the patient of the need to follow-up for further outpatient evaluation with Dr. Ubaldo Glassing  cardiology.  ____________________________________________  FINAL CLINICAL IMPRESSION(S) / ED DIAGNOSES  Final diagnoses:  Chest pain, unspecified type     MEDICATIONS GIVEN DURING THIS VISIT:  Medications  iohexol (OMNIPAQUE) 350 MG/ML injection 75 mL (75 mLs Intravenous Contrast Given 06/01/19 1537)     ED Discharge Orders    None      *Please note:  James Tyler was evaluated in Emergency Department on 06/01/2019 for the symptoms described in the history of present illness. He was evaluated in the context of the global COVID-19 pandemic, which necessitated consideration that the patient might be at risk for infection with the SARS-CoV-2 virus that causes COVID-19. Institutional protocols and algorithms that pertain to the evaluation of patients at risk for COVID-19 are in a state of rapid change based on information released by regulatory bodies including the CDC and federal and state organizations. These policies and algorithms were followed during the patient's care in the ED.  Some ED evaluations and interventions may be delayed as a result of limited staffing during the pandemic.*  Note:  This document was prepared using Dragon voice recognition software and may include unintentional dictation errors.   Gregor Hams, MD 06/01/19 2121

## 2019-06-01 NOTE — ED Triage Notes (Signed)
Pt in via POV, reports intermittent left side chest pain since this morning w/ associated shortness of breath.  [facility] in [facility].  NAD noted at this time.

## 2019-06-09 ENCOUNTER — Ambulatory Visit: Payer: BC Managed Care – PPO | Attending: Internal Medicine

## 2019-06-09 DIAGNOSIS — Z23 Encounter for immunization: Secondary | ICD-10-CM

## 2019-06-09 NOTE — Progress Notes (Signed)
   Covid-19 Vaccination Clinic  Name:  James Tyler    MRN: [id number] DOB: March 01, 1976  06/09/2019  [name] was observed post Covid-19 immunization for 15 minutes without incident. He was provided with Vaccine Information Sheet and instruction to access the V-Safe system.   [name] was instructed to call 911 with any severe reactions post vaccine: Marland Kitchen Difficulty breathing  . Swelling of face and throat  . A fast heartbeat  . A bad rash all over body  . Dizziness and weakness   Immunizations Administered    Name Date Dose VIS Date Route   Pfizer COVID-19 Vaccine 06/09/2019  3:45 PM 0.3 mL 02/25/2019 Intramuscular   Manufacturer: [place]   Lot: [id number]   [place]: [id number]

## 2019-07-04 ENCOUNTER — Ambulatory Visit: Payer: BC Managed Care – PPO | Attending: Internal Medicine

## 2019-07-04 DIAGNOSIS — Z23 Encounter for immunization: Secondary | ICD-10-CM

## 2019-07-04 NOTE — Progress Notes (Signed)
   Covid-19 Vaccination Clinic  Name:  [name]    MRN: [id number] DOB: [birth year]  07/04/2019  Mr. Bloome was observed post Covid-19 immunization for 15 minutes without incident. He was provided with Vaccine Information Sheet and instruction to access the V-Safe system.   Mr. [place] was instructed to call 911 with any severe reactions post vaccine: Marland Kitchen Difficulty breathing  . Swelling of face and throat  . A fast heartbeat  . A bad rash all over body  . Dizziness and weakness   Immunizations Administered    Name Date Dose VIS Date Route   Pfizer COVID-19 Vaccine 07/04/2019  1:52 PM 0.3 mL 05/11/2018 Intramuscular   Manufacturer: [place]   Lot: [id number]   [place]: [id number]

## 2020-07-04 IMAGING — DX DG CHEST 1V PORT
1 series · 1 of 1 positions shown · non-contrast
Comparison: 10/20/2009

CLINICAL DATA: Syncope. Evaluate for infiltrate. Right orchiectomy
yesterday.

EXAM:
PORTABLE CHEST 1 VIEW

[chest ap]
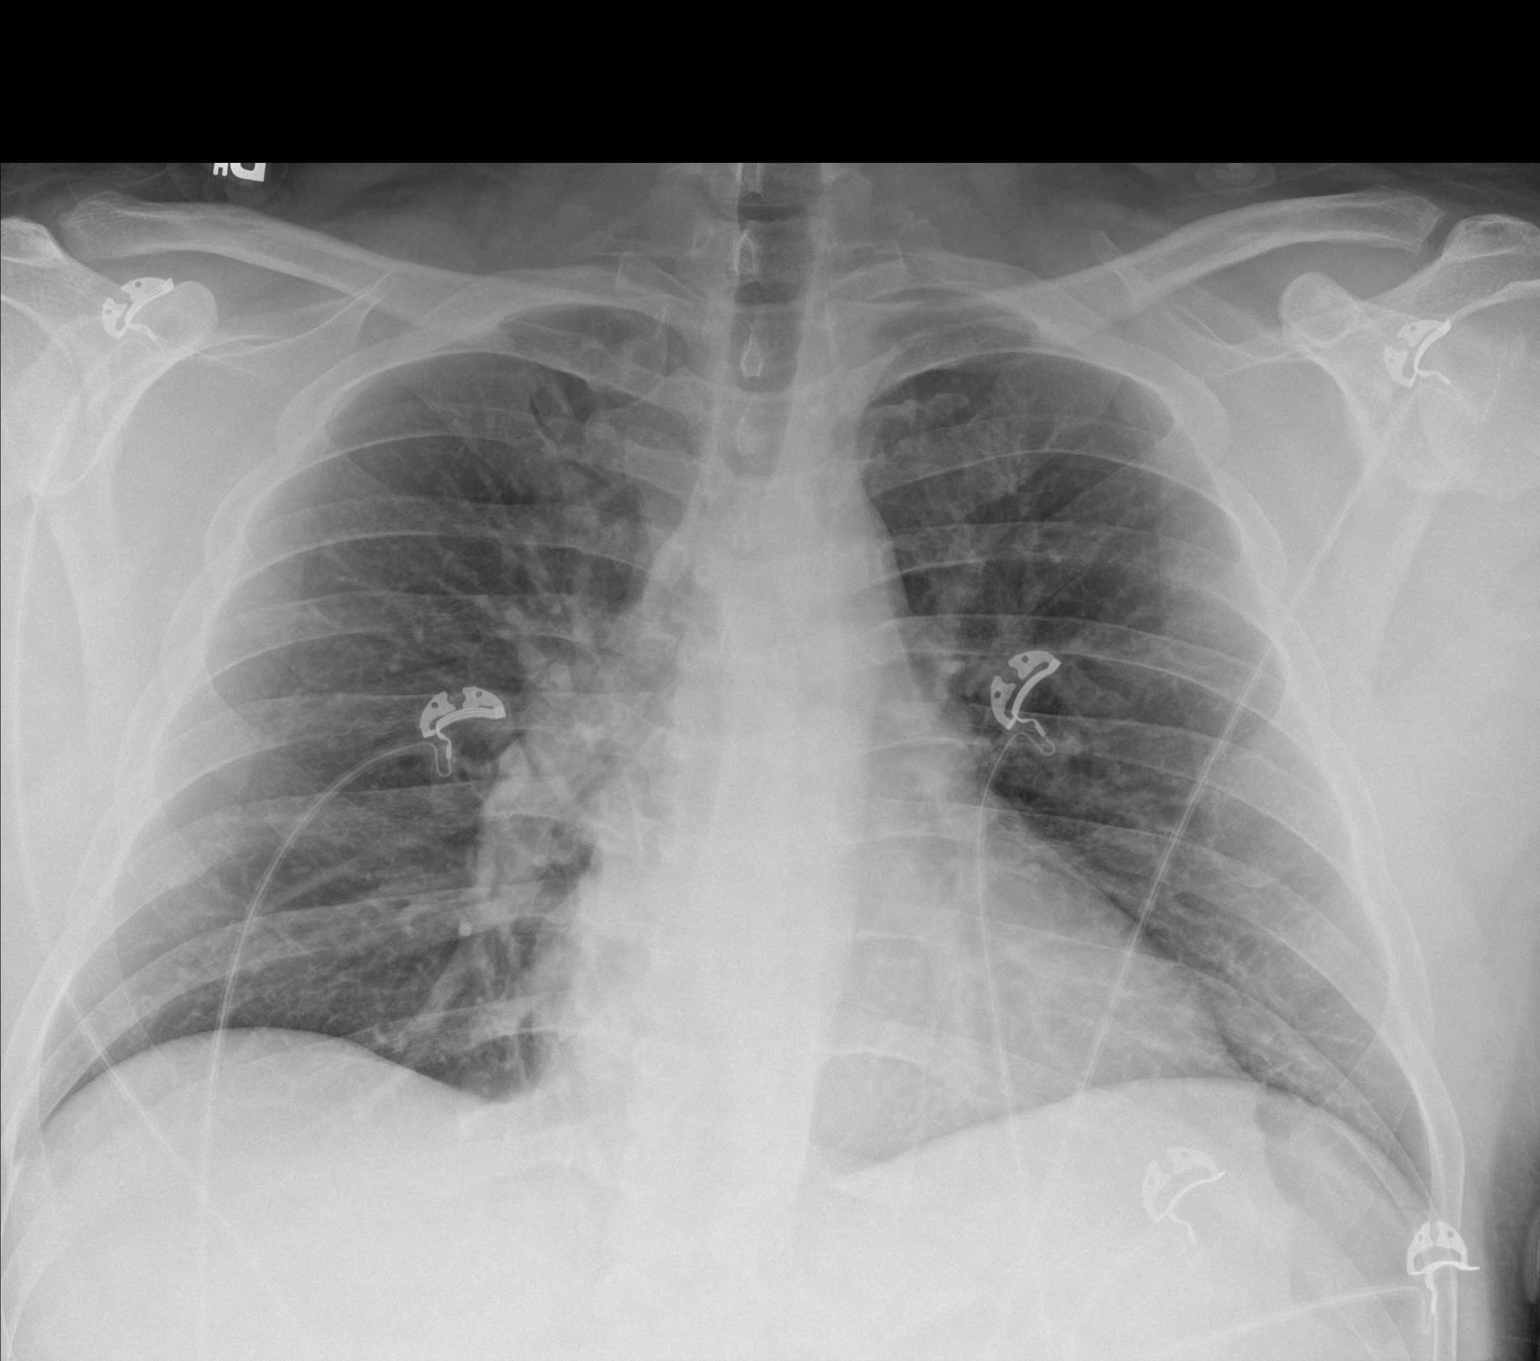

[1 of 1 positions shown; findings below may reference images not displayed]

FINDINGS: Lungs are adequately inflated without focal airspace consolidation
or effusion. Mild prominence of the right hilar region. Cardiac
silhouette and remainder of the exam is unchanged.
IMPRESSION: No active disease.

Mild prominence of the right hilar region. Consider follow-up PA and
lateral chest radiograph for better evaluation of possible
underlying adenopathy.

## 2020-07-29 IMAGING — CT CT ANGIO CHEST
2 of 6 series · 17 of 46 positions shown · IV contrast (APPLIED)
Comparison: 05/07/2019

CLINICAL DATA: Chest pain with dyspnea. History of thyroid and
testicular cancer. Intermittent left-sided chest pain since this
morning with associated shortness of breath.

EXAM:
CT ANGIOGRAPHY CHEST WITH CONTRAST
TECHNIQUE: Multidetector CT imaging of the chest was performed using the
standard protocol during bolus administration of intravenous
contrast. Multiplanar CT image reconstructions and MIPs were
obtained to evaluate the vascular anatomy.
CONTRAST:  75mL OMNIPAQUE IOHEXOL 350 MG/ML SOLN

[Series 5: thins · axial · 0.77mm/px · z∈[-293,-52]mm · 14 of 265 slices shown]
[im 12/265  lung]
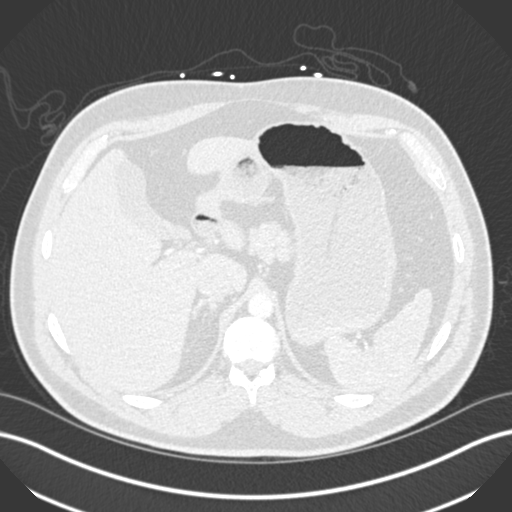
[im 35/265  soft-tissue]
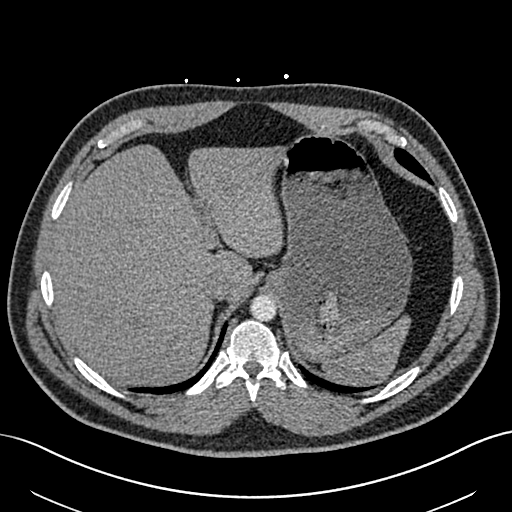
[im 46/265  lung]
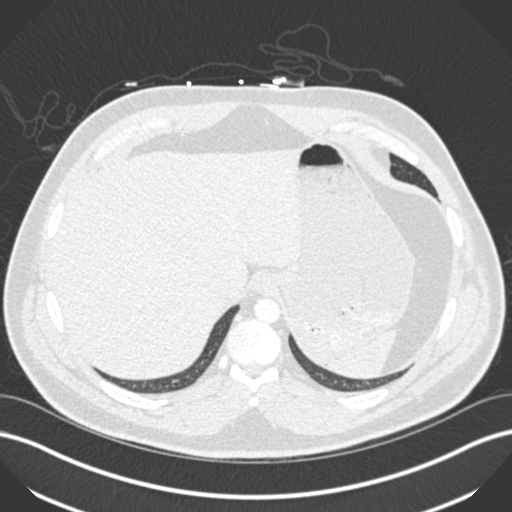
[im 69/265  soft-tissue]
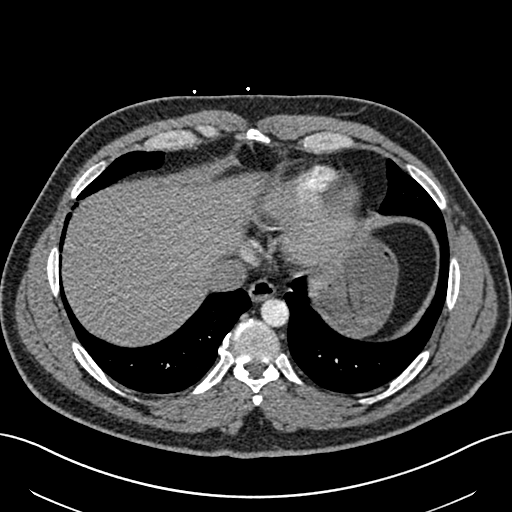
[im 92/265  lung]
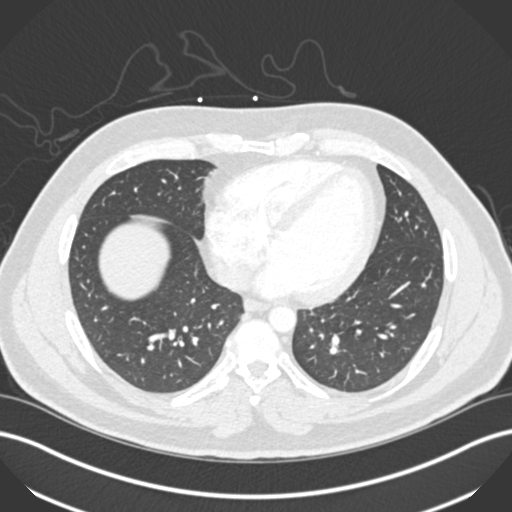
[im 104/265  soft-tissue]
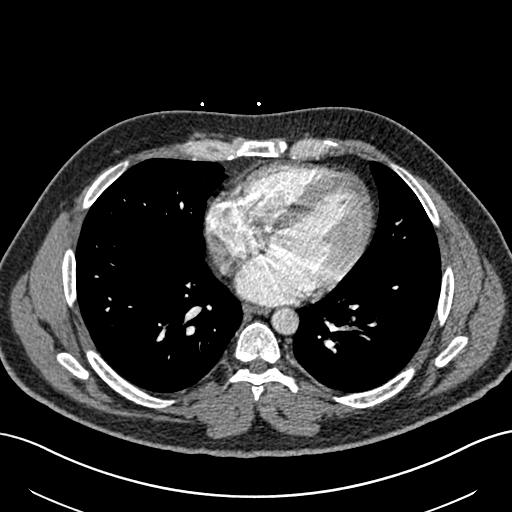
[im 127/265  lung]
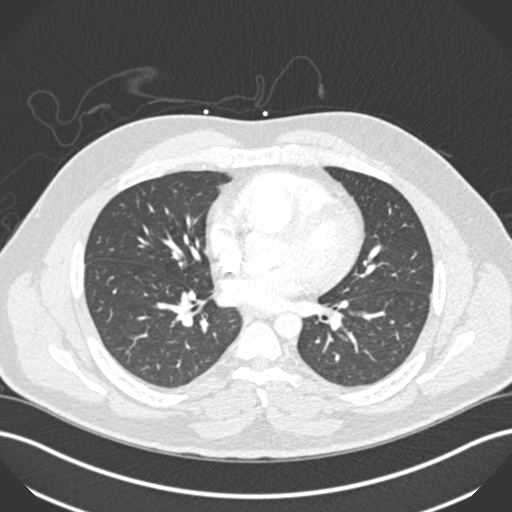
[im 138/265  soft-tissue]
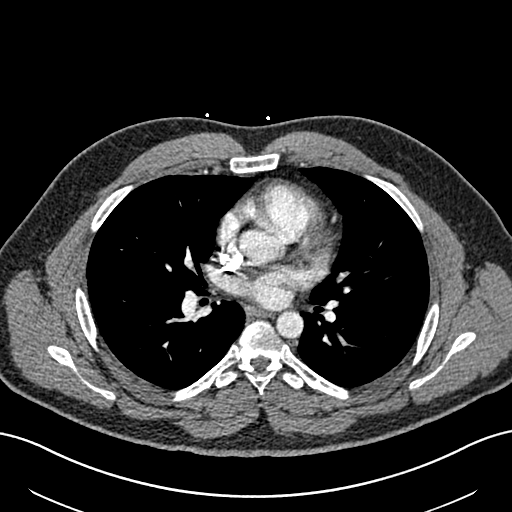
[im 161/265  lung]
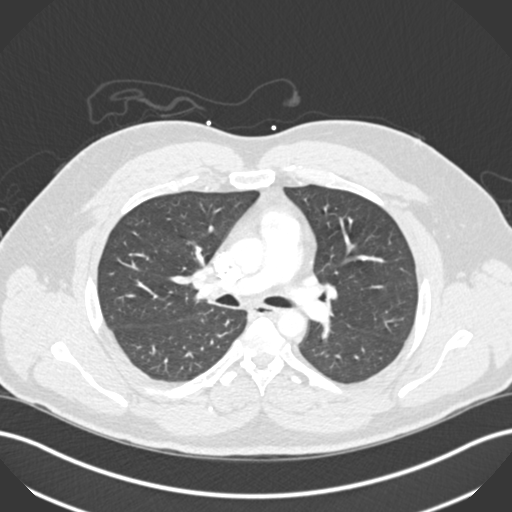
[im 173/265  soft-tissue]
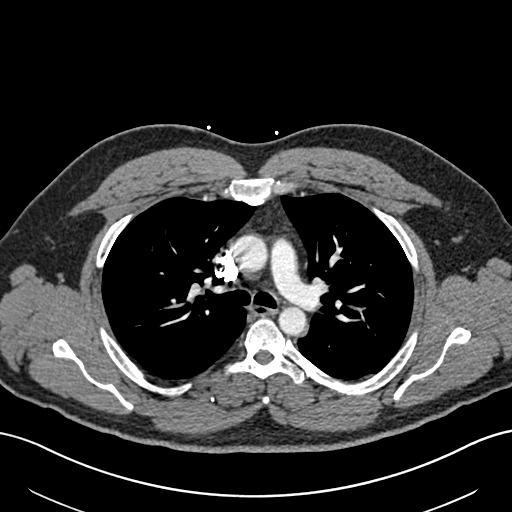
[im 196/265  lung]
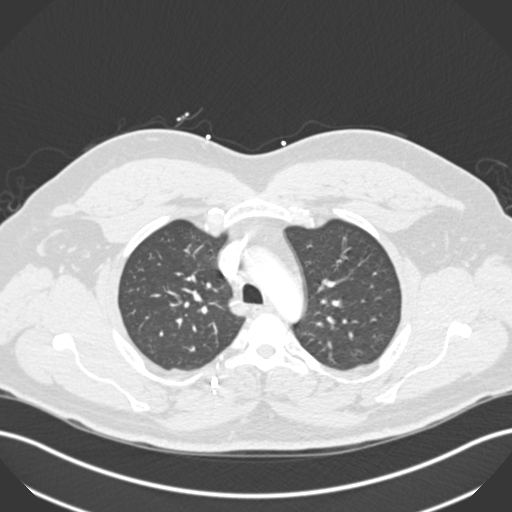
[im 219/265  soft-tissue]
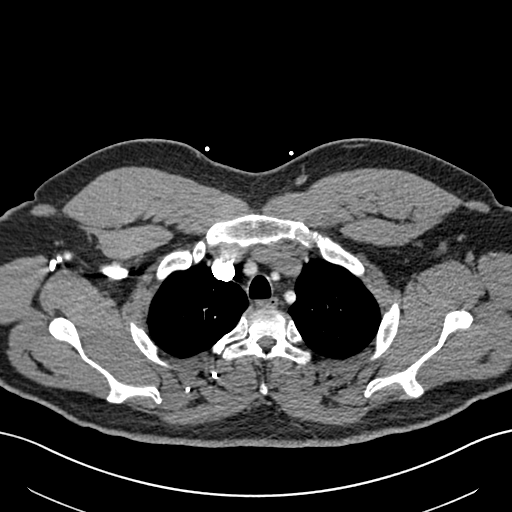
[im 230/265  lung]
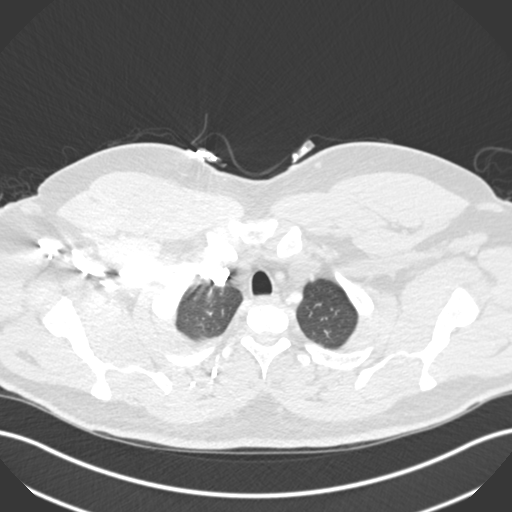
[im 253/265  soft-tissue]
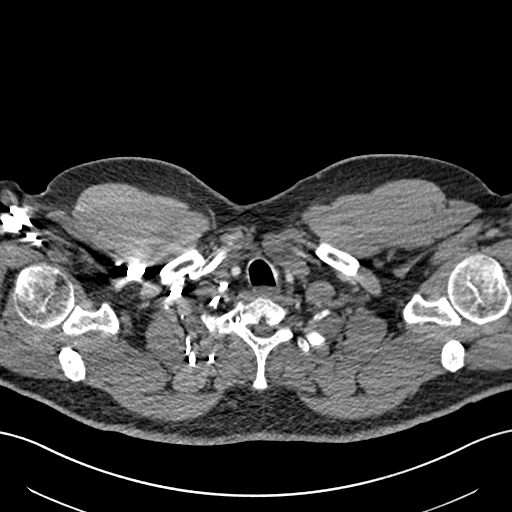

[Series 7: coronal mpr · coronal · 0.53mm/px · 3 of 88 slices shown]
[im 22/88  soft-tissue]
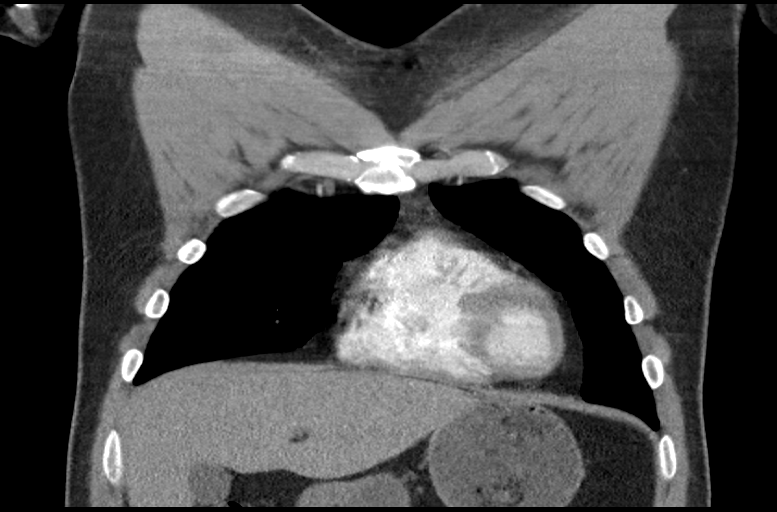
[im 44/88  soft-tissue]
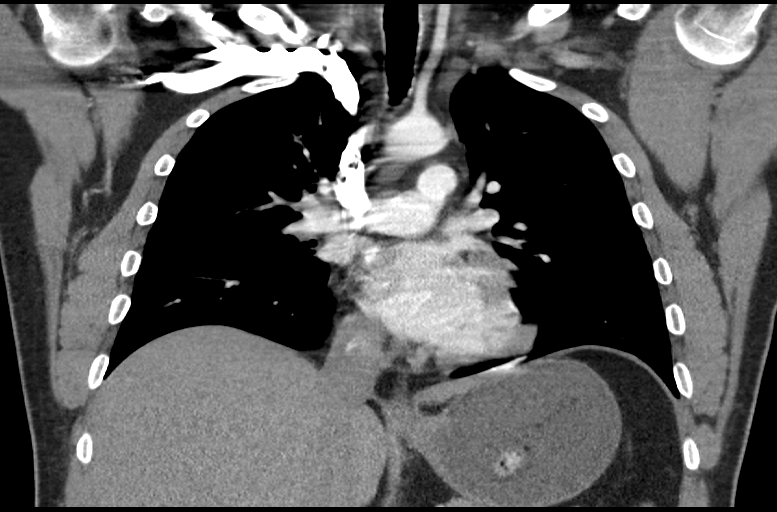
[im 66/88  soft-tissue]
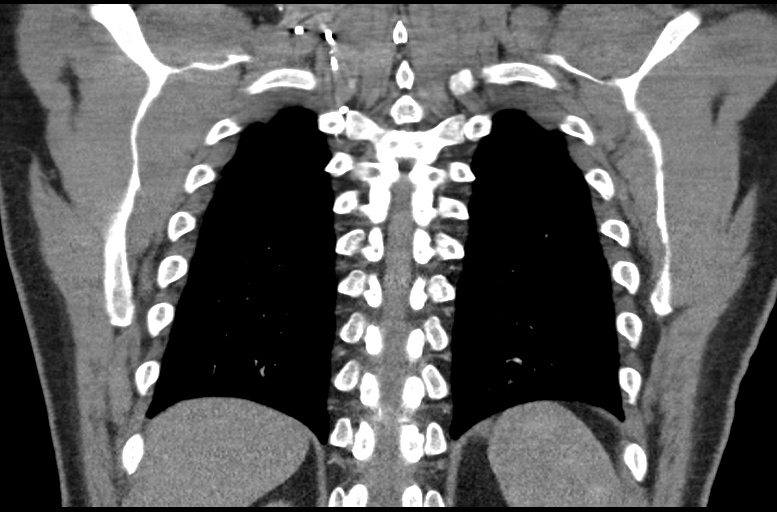

[17 of 46 positions shown; findings below may reference images not displayed]

FINDINGS: Cardiovascular: Contrast injection is sufficient to demonstrate
satisfactory opacification of the pulmonary arteries to the
segmental level. There is no pulmonary embolus. The main pulmonary
artery is within normal limits for size. There is no CT evidence of
acute right heart strain. The visualized aorta is normal. Heart size
is normal, without pericardial effusion.

Mediastinum/Nodes:

--No mediastinal or hilar lymphadenopathy.

--No axillary lymphadenopathy.

--No supraclavicular lymphadenopathy.

--Normal thyroid gland.

--The esophagus is unremarkable

Lungs/Pleura: No pulmonary nodules or masses. No pleural effusion or
pneumothorax. No focal airspace consolidation. No focal pleural
abnormality.

Upper Abdomen: No acute abnormality.

Musculoskeletal: No chest wall abnormality. No acute or significant
osseous findings.

Review of the MIP images confirms the above findings.
IMPRESSION: 1. No evidence of pulmonary embolism.
2. No acute findings in the chest.

## 2020-07-29 IMAGING — CR DG CHEST 2V
1 series · 2 of 2 positions shown · non-contrast
Comparison: 05/07/2019

CLINICAL DATA: Left side chest pain

EXAM:
CHEST - 2 VIEW

[Series 1: dg chest 2 view · 0.14mm/px · 2 of 2 slices shown]
[im 1/2]
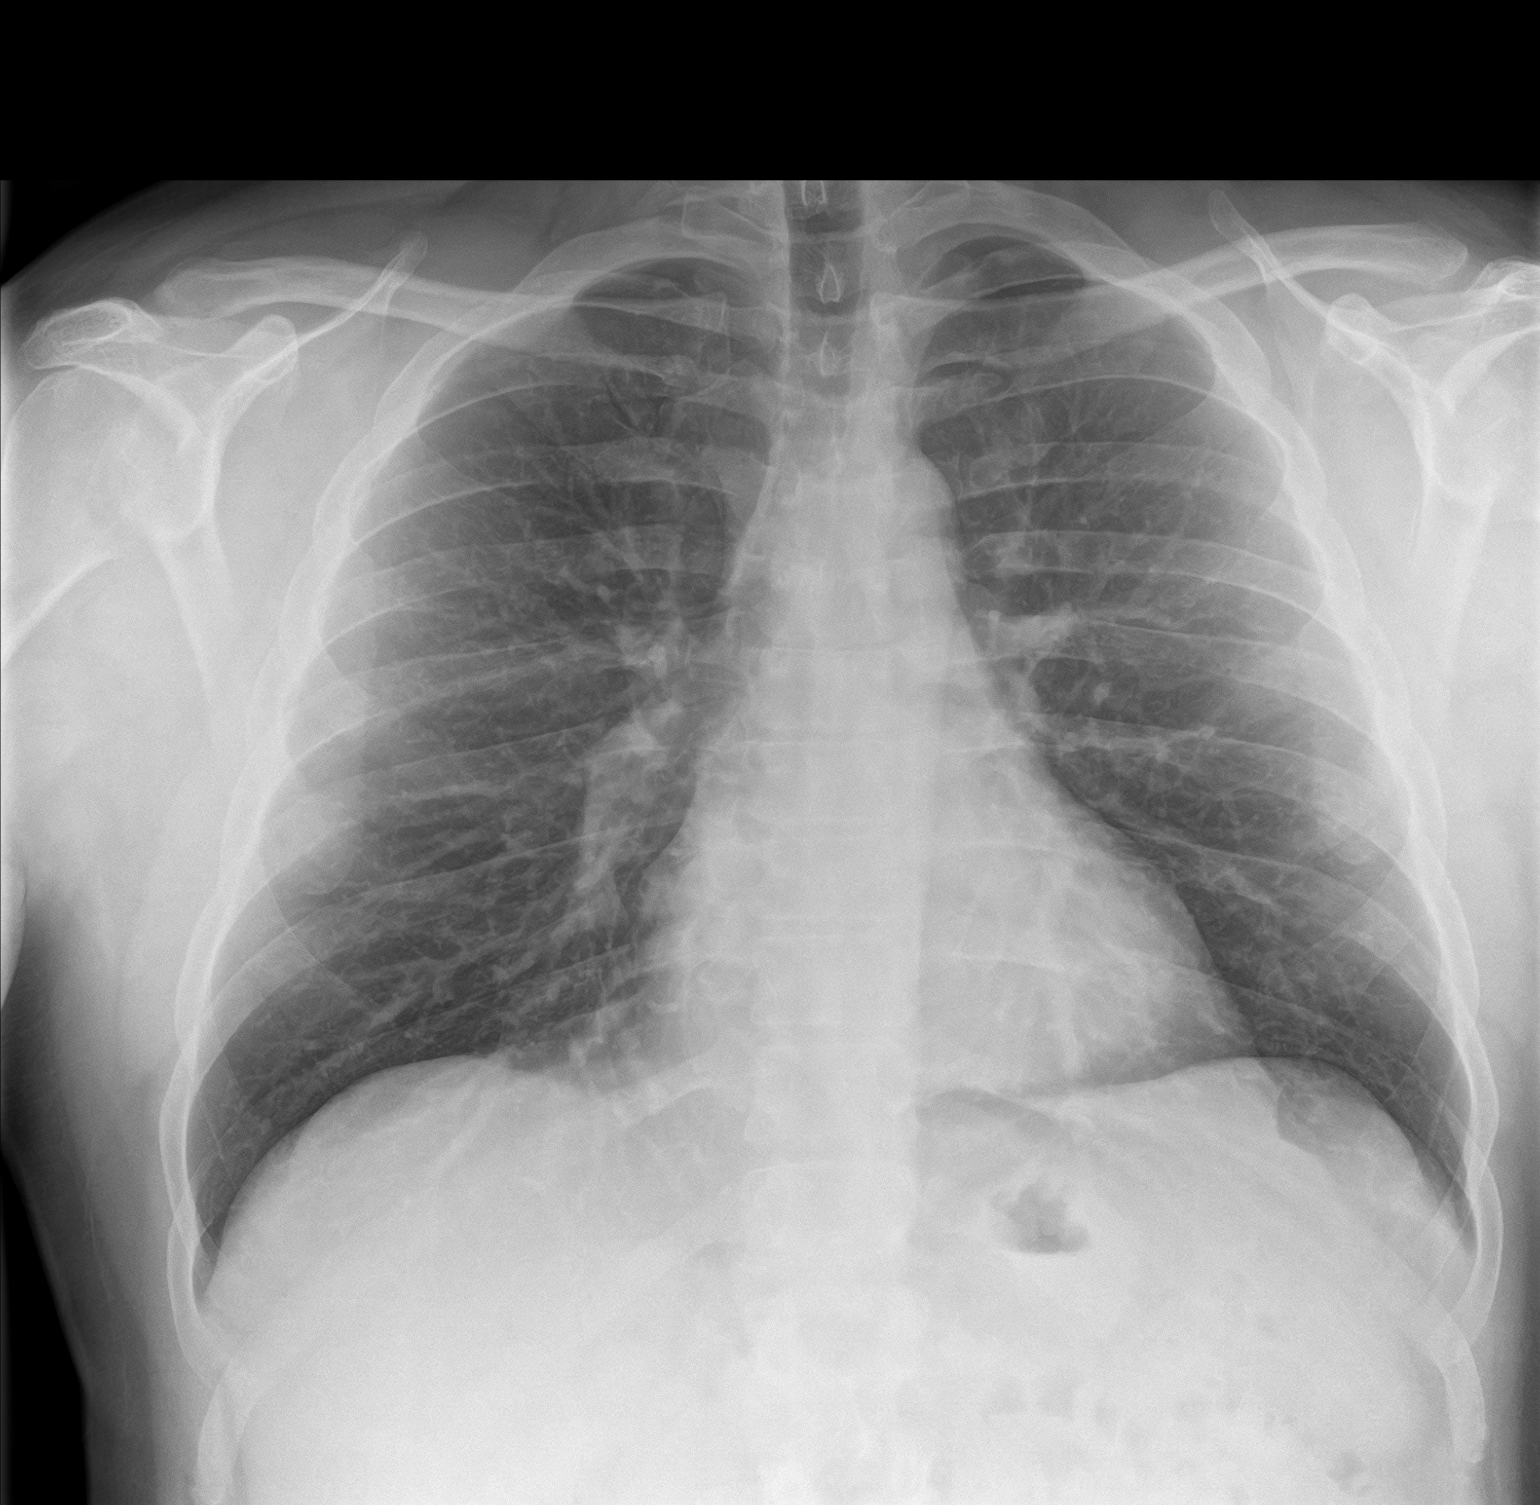
[im 2/2]
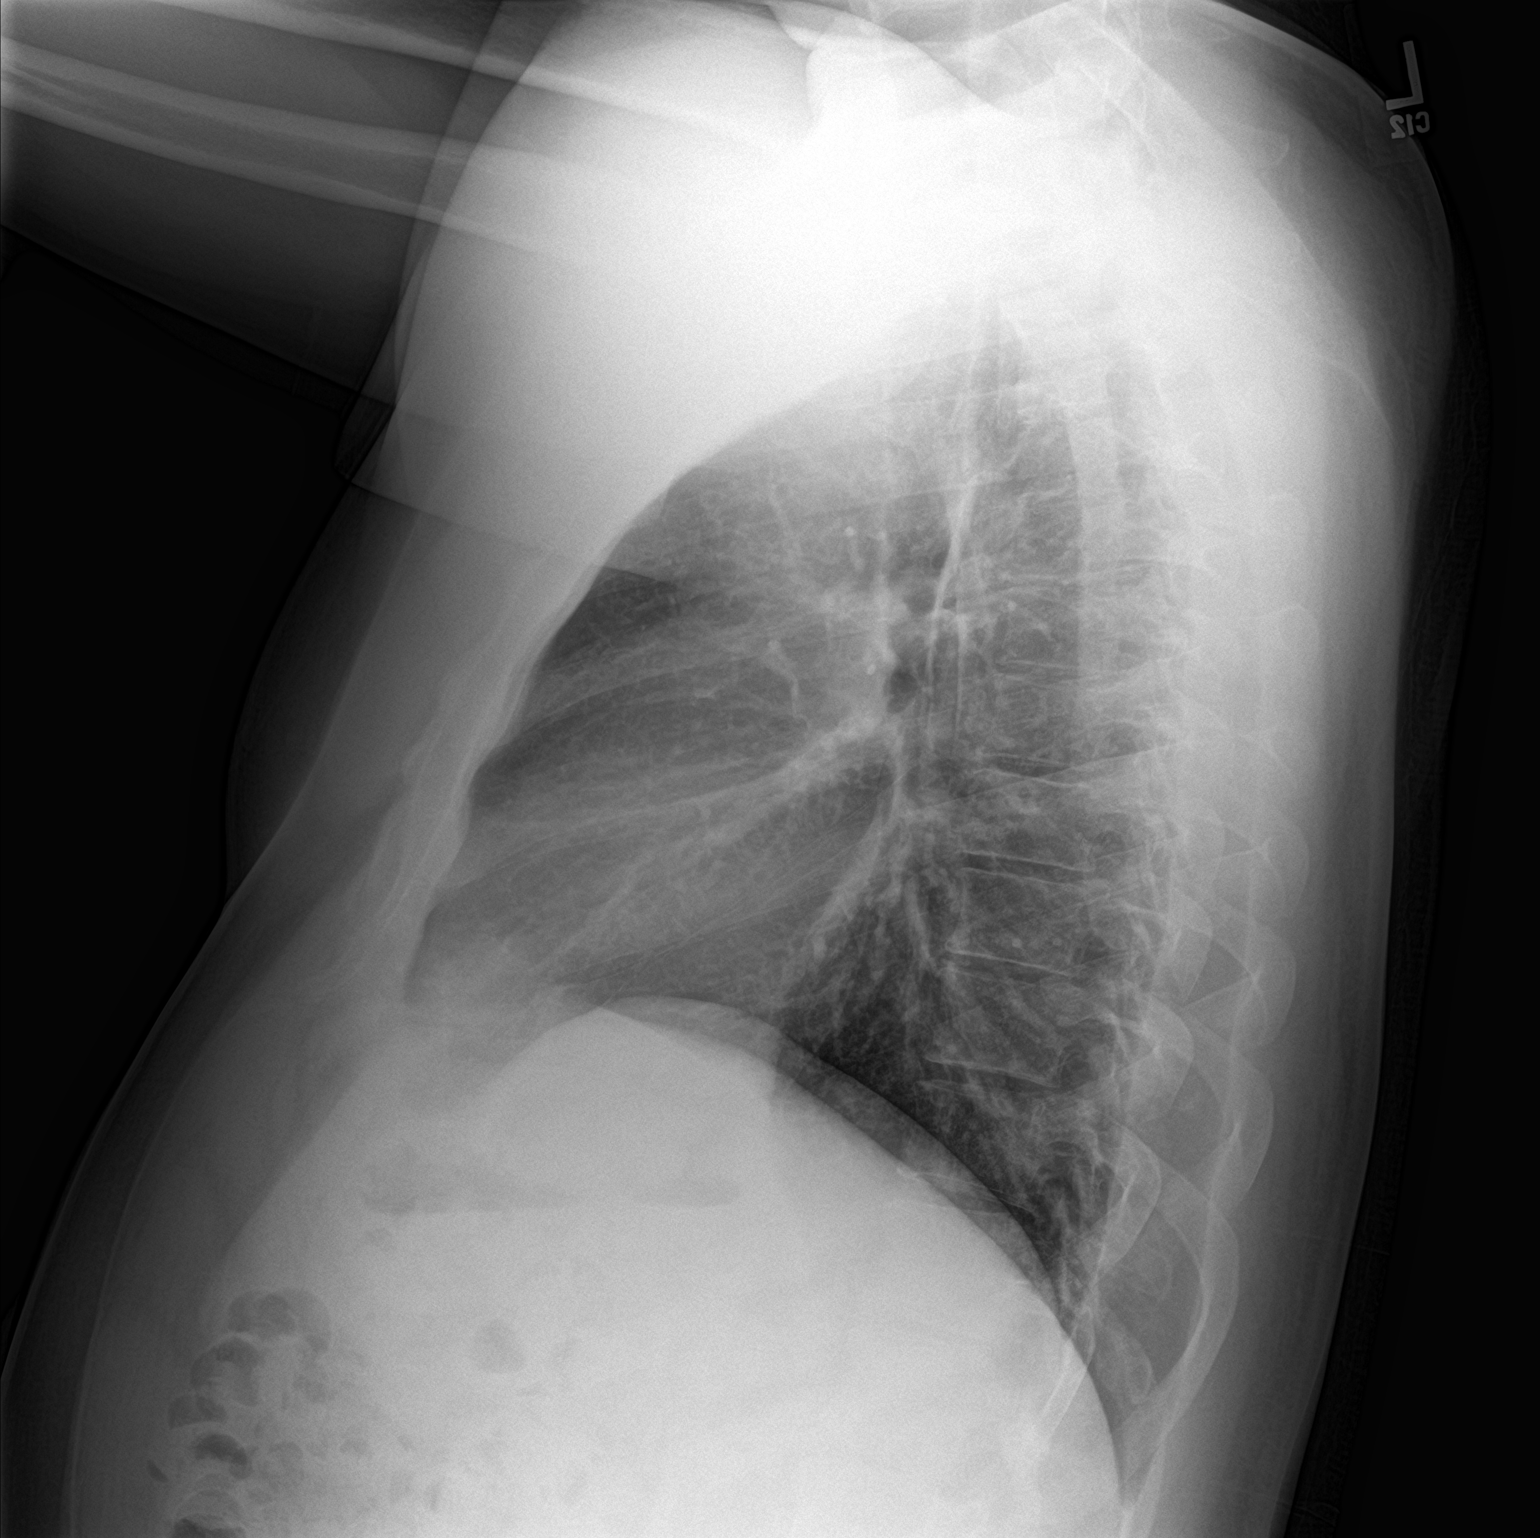

[2 of 2 positions shown; findings below may reference images not displayed]

FINDINGS: Heart and mediastinal contours are within normal limits. No focal
opacities or effusions. No acute bony abnormality.
IMPRESSION: Normal study.

## 2021-08-28 ENCOUNTER — Encounter: Payer: Self-pay | Admitting: *Deleted
